# Patient Record
Sex: Male | Born: 2000 | Race: Black or African American | Hispanic: No | Marital: Single | Smoking: Current every day smoker
Health system: Southern US, Community
[De-identification: ages and names within clinical notes are randomized; demographics above are authoritative.]

---
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
## 2019-03-21 ENCOUNTER — Encounter (HOSPITAL_COMMUNITY): Payer: Self-pay | Admitting: *Deleted

## 2019-03-21 ENCOUNTER — Other Ambulatory Visit: Payer: Self-pay

## 2019-03-21 ENCOUNTER — Emergency Department (HOSPITAL_COMMUNITY): Payer: Federal, State, Local not specified - PPO

## 2019-03-21 ENCOUNTER — Emergency Department (HOSPITAL_COMMUNITY)
Admission: EM | Admit: 2019-03-21 | Discharge: 2019-03-21 | Disposition: A | Discharge status: Home / Self Care | Payer: Federal, State, Local not specified - PPO | Attending: Emergency Medicine | Admitting: Emergency Medicine

## 2019-03-21 DIAGNOSIS — T07XXXA Unspecified multiple injuries, initial encounter: Secondary | ICD-10-CM | POA: Diagnosis not present

## 2019-03-21 DIAGNOSIS — S2194XS Puncture wound with foreign body of unspecified part of thorax, sequela: Secondary | ICD-10-CM | POA: Diagnosis not present

## 2019-03-21 DIAGNOSIS — Y939 Activity, unspecified: Secondary | ICD-10-CM | POA: Insufficient documentation

## 2019-03-21 DIAGNOSIS — Z23 Encounter for immunization: Secondary | ICD-10-CM | POA: Insufficient documentation

## 2019-03-21 DIAGNOSIS — F17210 Nicotine dependence, cigarettes, uncomplicated: Secondary | ICD-10-CM | POA: Insufficient documentation

## 2019-03-21 DIAGNOSIS — Y999 Unspecified external cause status: Secondary | ICD-10-CM | POA: Diagnosis not present

## 2019-03-21 DIAGNOSIS — Y249XXA Unspecified firearm discharge, undetermined intent, initial encounter: Secondary | ICD-10-CM | POA: Insufficient documentation

## 2019-03-21 DIAGNOSIS — S41101A Unspecified open wound of right upper arm, initial encounter: Secondary | ICD-10-CM | POA: Diagnosis not present

## 2019-03-21 DIAGNOSIS — S21402A Unspecified open wound of left back wall of thorax with penetration into thoracic cavity, initial encounter: Secondary | ICD-10-CM | POA: Diagnosis not present

## 2019-03-21 DIAGNOSIS — Y929 Unspecified place or not applicable: Secondary | ICD-10-CM | POA: Insufficient documentation

## 2019-03-21 DIAGNOSIS — M25519 Pain in unspecified shoulder: Secondary | ICD-10-CM | POA: Diagnosis not present

## 2019-03-21 DIAGNOSIS — R52 Pain, unspecified: Secondary | ICD-10-CM | POA: Diagnosis not present

## 2019-03-21 DIAGNOSIS — W3400XA Accidental discharge from unspecified firearms or gun, initial encounter: Secondary | ICD-10-CM

## 2019-03-21 LAB — PREPARE FRESH FROZEN PLASMA
Unit division: 0
Unit division: 0

## 2019-03-21 LAB — BASIC METABOLIC PANEL
Anion gap: 10 (ref 5–15)
BUN: 12 mg/dL (ref 6–20)
CO2: 23 mmol/L (ref 22–32)
Calcium: 9.7 mg/dL (ref 8.9–10.3)
Chloride: 106 mmol/L (ref 98–111)
Creatinine, Ser: 1.28 mg/dL — ABNORMAL HIGH (ref 0.61–1.24)
GFR calc Af Amer: >60 mL/min (ref >60)
GFR calc non Af Amer: >60 mL/min (ref >60)
Glucose, Bld: 94 mg/dL (ref 70–99)
Potassium: 3.6 mmol/L (ref 3.5–5.1)
Sodium: 139 mmol/L (ref 135–145)

## 2019-03-21 LAB — CBC
HCT: 38.3 % — ABNORMAL LOW (ref 39.0–52.0)
Hemoglobin: 12.2 g/dL — ABNORMAL LOW (ref 13.0–17.0)
MCH: 26.8 pg (ref 26.0–34.0)
MCHC: 31.9 g/dL (ref 30.0–36.0)
MCV: 84 fL (ref 80.0–100.0)
Platelets: 201 10*3/uL (ref 150–400)
RBC: 4.56 MIL/uL (ref 4.22–5.81)
RDW: 14.2 % (ref 11.5–15.5)
WBC: 9.9 10*3/uL (ref 4.0–10.5)
nRBC: 0 % (ref 0.0–0.2)

## 2019-03-21 LAB — TYPE AND SCREEN
Unit division: 0
Unit division: 0

## 2019-03-21 LAB — BPAM RBC
Blood Product Expiration Date: 202004302359
Blood Product Expiration Date: 202004302359
ISSUE DATE / TIME: 202004120456
ISSUE DATE / TIME: 202004120456
Unit Type and Rh: 5100
Unit Type and Rh: 5100

## 2019-03-21 LAB — BPAM FFP
Blood Product Expiration Date: 202004182359
Blood Product Expiration Date: 202004182359
ISSUE DATE / TIME: 202004120456
ISSUE DATE / TIME: 202004120456
Unit Type and Rh: 6200
Unit Type and Rh: 6200

## 2019-03-21 MED ORDER — SULFAMETHOXAZOLE-TRIMETHOPRIM 800-160 MG PO TABS
1.0000 | ORAL_TABLET | Freq: Once | ORAL | Status: AC
  Administered 2019-03-21: 07:00:00 1 via ORAL
  Filled 2019-03-21: qty 1

## 2019-03-21 MED ORDER — IOHEXOL 300 MG/ML  SOLN
75.0000 mL | Freq: Once | INTRAMUSCULAR | Status: AC | PRN
  Administered 2019-03-21: 75 mL via INTRAVENOUS

## 2019-03-21 MED ORDER — TETANUS-DIPHTH-ACELL PERTUSSIS 5-2.5-18.5 LF-MCG/0.5 IM SUSP
0.5000 mL | Freq: Once | INTRAMUSCULAR | Status: AC
  Administered 2019-03-21: 07:00:00 0.5 mL via INTRAMUSCULAR
  Filled 2019-03-21: qty 0.5

## 2019-03-21 MED ORDER — SULFAMETHOXAZOLE-TRIMETHOPRIM 800-160 MG PO TABS: 1.0000 | ORAL_TABLET | Freq: Two times a day (BID) | ORAL | 0 refills | Status: AC
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
## 2019-03-21 NOTE — ED Notes (Signed)
Dr. pollina at the bedside.  

## 2019-03-21 NOTE — ED Notes (Signed)
Down graded to a  Level 2
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
## 2019-03-21 NOTE — Discharge Instructions (Signed)
Go to [facility] urgent care or return here in 7 to 10 days to have sutures removed.
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
## 2019-03-21 NOTE — ED Notes (Signed)
bp 152/ 98 manual

## 2019-03-22 ENCOUNTER — Encounter (HOSPITAL_COMMUNITY): Payer: Self-pay | Admitting: Emergency Medicine

## 2019-09-25 ENCOUNTER — Other Ambulatory Visit: Payer: Self-pay

## 2019-09-25 ENCOUNTER — Encounter (HOSPITAL_COMMUNITY): Payer: Self-pay

## 2019-09-25 ENCOUNTER — Ambulatory Visit (HOSPITAL_COMMUNITY)
Admission: EM | Admit: 2019-09-25 | Discharge: 2019-09-25 | Disposition: A | Discharge status: Home / Self Care | Payer: Federal, State, Local not specified - PPO | Attending: Family Medicine | Admitting: Family Medicine

## 2019-09-25 DIAGNOSIS — L03011 Cellulitis of right finger: Secondary | ICD-10-CM | POA: Diagnosis not present

## 2019-09-25 MED ORDER — SULFAMETHOXAZOLE-TRIMETHOPRIM 800-160 MG PO TABS: 1.0000 | ORAL_TABLET | Freq: Two times a day (BID) | ORAL | 0 refills | Status: AC

## 2019-09-25 NOTE — ED Provider Notes (Signed)
Jeremy Nelson    CSN: [id number] Arrival date & time: 09/25/19  1123      History   Chief Complaint Chief Complaint  Patient presents with  . Nail Problem    HPI Jeremy Nelson is a 18 y.o. adult.   Jeremy Nelson presents with complaints of right hand ring finger with pain swelling and drainage. Started 1 week ago and has been working. Drainage noted today. Has been taking OTC medications such as aleve and ibuprofen to help with pain with minimal relief. Denies pulling a hang nail or biting his nails. Denies any previous similar. No fevers. Without contributing medical history.      ROS per HPI, negative if not otherwise mentioned.      History reviewed. No pertinent past medical history.  There are no active problems to display for this patient.   History reviewed. No pertinent surgical history.  OB History   No obstetric history on file.      Home Medications    Prior to Admission medications   Medication Sig Start Date End Date Taking? Authorizing Provider  ondansetron (ZOFRAN ODT) 4 MG disintegrating tablet Take 1 tablet (4 mg total) by mouth every 8 (eight) hours as needed for nausea or vomiting. 11/15/16   Charmayne Sheer, NP  sulfamethoxazole-trimethoprim (BACTRIM DS) 800-160 MG tablet Take 1 tablet by mouth 2 (two) times daily for 7 days. 09/25/19 10/02/19  Zigmund Gottron, NP  traMADol-acetaminophen (ULTRACET) 37.5-325 MG per tablet Take 1 tablet by mouth every 6 (six) hours as needed for pain. 03/09/13   Janne Napoleon, NP    Family History No family history on file.  Social History Social History   Tobacco Use  . Smoking status: Current Every Day Smoker  . Smokeless tobacco: Never Used  Substance Use Topics  . Alcohol use: Yes  . Drug use: Never    Types: Marijuana     Allergies   Patient has no known allergies.   Review of Systems Review of Systems   Physical Exam Triage Vital Signs ED Triage Vitals [09/25/19 1232]   Enc Vitals Group     BP (!) 141/95     Pulse Rate 73     Resp 16     Temp 98.5 F (36.9 C)     Temp Source Oral     SpO2 100 %     Weight 146 lb (66.2 kg)     Height      Head Circumference      Peak Flow      Pain Score 8     Pain Loc      Pain Edu?      Excl. in [place]?    No data found.  Updated Vital Signs BP (!) 141/95 (BP Location: Right Arm)   Pulse 73   Temp 98.5 F (36.9 C) (Oral)   Resp 16   Wt 146 lb (66.2 kg)   SpO2 100%   BMI 18.25 kg/m    Physical Exam Constitutional:      General: He is not in acute distress.    Appearance: He is well-developed.  Cardiovascular:     Rate and Rhythm: Normal rate.  Pulmonary:     Effort: Pulmonary effort is normal.  Musculoskeletal:     Comments: Right hand ring finger with gross swelling, significant tenderness, and redness surrounding nail bed with purulent drainage; pad of finger remains soft and with minimal tenderness  Skin:    General: Skin  is warm and dry.  Neurological:     Mental Status: He is alert and oriented to person, place, and time.      UC Treatments / Results  Labs (all labs ordered are listed, but only abnormal results are displayed) Labs Reviewed - No data to display  EKG   Radiology No results found.  Procedures Procedures (including critical care time)  Medications Ordered in UC Medications - No data to display  Initial Impression / Assessment and Plan / UC Course  I have reviewed the triage vital signs and the nursing notes.  Pertinent labs & imaging results that were available during my care of the patient were reviewed by me and considered in my medical decision making (see chart for details).    Significant paronychia on exam, already draining (dripping down his finger even), deferred additional incision today. Encouraged warm soaks to promote further drainage, and antibiotics initiated. Return precautions provided. Patient verbalized understanding and agreeable to plan.    Final Clinical Impressions(s) / UC Diagnoses   Final diagnoses:  Paronychia of finger of right hand     Discharge Instructions     Soak in warm water 3-5 times a day for 10-20 minutes at a time to promote further drainage from the finger.  Complete course of antibiotics.  Tylenol and/or ibuprofen as needed for pain or fevers.  Don't take aleve and ibuprofen, one or the other.  I expect gradual improvement over the next week.  If worsening, especially if a lot of pain to the pad of the finger, or no improvement at all in the next 5 days please return to be seen.    ED Prescriptions    Medication Sig Dispense Auth. Provider   sulfamethoxazole-trimethoprim (BACTRIM DS) 800-160 MG tablet Take 1 tablet by mouth 2 (two) times daily for 7 days. 14 tablet Georgetta Haber, NP     PDMP not reviewed this encounter.   Georgetta Haber, NP 09/25/19 1256

## 2019-09-25 NOTE — ED Triage Notes (Signed)
Pt states he has infected finger x 1 week. ( right hand ring finger )

## 2019-09-25 NOTE — Discharge Instructions (Signed)
Soak in warm water 3-5 times a day for 10-20 minutes at a time to promote further drainage from the finger.  Complete course of antibiotics.  Tylenol and/or ibuprofen as needed for pain or fevers.  Don't take aleve and ibuprofen, one or the other.  I expect gradual improvement over the next week.  If worsening, especially if a lot of pain to the pad of the finger, or no improvement at all in the next 5 days please return to be seen.

## 2024-06-15 ENCOUNTER — Ambulatory Visit
Admission: EM | Admit: 2024-06-15 | Discharge: 2024-06-15 | Disposition: A | Discharge status: Home / Self Care | Attending: Family Medicine | Admitting: Family Medicine

## 2024-06-15 DIAGNOSIS — H1012 Acute atopic conjunctivitis, left eye: Secondary | ICD-10-CM | POA: Diagnosis not present

## 2024-06-15 MED ORDER — AZELASTINE HCL 0.05 % OP SOLN: 1.0000 [drp] | Freq: Two times a day (BID) | OPHTHALMIC | 0 refills | Status: AC

## 2024-06-15 NOTE — ED Provider Notes (Signed)
 [facility]    CSN: [id number] Arrival date & time: 06/15/24  1901      History   Chief Complaint Chief Complaint  Patient presents with   Eye Problem    HPI Jeremy Nelson is a 23 y.o. adult presents for eye redness.  Patient reports 2 to 3 days of left eye redness with watery drainage, itching and irritation.  Denies foreign body sensation, photophobia, injury to the eye, visual changes.  Does not wear glasses or contacts.  No URI symptoms.  Has not used any OTC treatments for symptoms.  No other concerns at this time.   Eye Problem Associated symptoms: itching and redness     History reviewed. No pertinent past medical history.  There are no active problems to display for this patient.   History reviewed. No pertinent surgical history.  OB History   No obstetric history on file.      Home Medications    Prior to Admission medications   Medication Sig Start Date End Date Taking? Authorizing Provider  azelastine  (OPTIVAR ) 0.05 % ophthalmic solution Place 1 drop into the left eye 2 (two) times daily for 7 days. 06/15/24 06/22/24 Yes Kendric Sindelar, Jodi R, NP  ondansetron  (ZOFRAN  ODT) 4 MG disintegrating tablet Take 1 tablet (4 mg total) by mouth every 8 (eight) hours as needed for nausea or vomiting. 11/15/16   Lang Maxwell, NP  traMADol -acetaminophen  (ULTRACET ) 37.5-325 MG per tablet Take 1 tablet by mouth every 6 (six) hours as needed for pain. 03/09/13   Tharon Lenis, NP    Family History History reviewed. No pertinent family history.  Social History Social History   Tobacco Use   Smoking status: Every Day   Smokeless tobacco: Never  Substance Use Topics   Alcohol use: Yes   Drug use: Never    Types: Marijuana     Allergies   Patient has no known allergies.   Review of Systems Review of Systems  Eyes:  Positive for redness and itching.     Physical Exam Triage Vital Signs ED Triage Vitals [06/15/24 1909]  Encounter Vitals Group     BP  132/83     Girls Systolic BP Percentile      Girls Diastolic BP Percentile      Boys Systolic BP Percentile      Boys Diastolic BP Percentile      Pulse Rate 84     Resp 17     Temp      Temp src      SpO2 98 %     Weight      Height      Head Circumference      Peak Flow      Pain Score      Pain Loc      Pain Education      Exclude from Growth Chart    No data found.  Updated Vital Signs BP 132/83 (BP Location: Right Arm)   Pulse 84   Resp 17   SpO2 98%   Visual Acuity Right Eye Distance: 20/50 Left Eye Distance: 20/30 Bilateral Distance:    Right Eye Near:   Left Eye Near:    Bilateral Near:     Physical Exam Vitals and nursing note reviewed.  Constitutional:      General: He is not in acute distress.    Appearance: Normal appearance. He is not ill-appearing.  HENT:     Head: Normocephalic and atraumatic.  Eyes:  General: Lids are normal.        Left eye: No foreign body, discharge or hordeolum.     Conjunctiva/sclera:     Left eye: Left conjunctiva is injected. No chemosis, exudate or hemorrhage.    Pupils: Pupils are equal, round, and reactive to light.     Comments: Watery drainage noted  Cardiovascular:     Rate and Rhythm: Normal rate.  Pulmonary:     Effort: Pulmonary effort is normal.  Skin:    General: Skin is warm and dry.  Neurological:     General: No focal deficit present.     Mental Status: He is alert and oriented to person, place, and time.  Psychiatric:        Mood and Affect: Mood normal.        Behavior: Behavior normal.      UC Treatments / Results  Labs (all labs ordered are listed, but only abnormal results are displayed) Labs Reviewed - No data to display  EKG   Radiology No results found.  Procedures Procedures (including critical care time)  Medications Ordered in UC Medications - No data to display  Initial Impression / Assessment and Plan / UC Course  I have reviewed the triage vital signs and the  nursing notes.  Pertinent labs & imaging results that were available during my care of the patient were reviewed by me and considered in my medical decision making (see chart for details).     Reviewed exam and symptoms with patient.  No red flags.  Discussed exam and symptoms consistent with allergic conjunctivitis.  Visual acuity with better acuity in the affected eye.  Start antihistamine eyedrops as prescribed.  Warm compresses as needed.  PCP follow-up if symptoms do not improve.  ER precautions reviewed. Final Clinical Impressions(s) / UC Diagnoses   Final diagnoses:  Allergic conjunctivitis of left eye     Discharge Instructions      Start antihistamine eyedrops twice daily for the next 5 to 7 days.  You may do warm compresses to the eye as needed.  Please follow-up with your PCP if your symptoms do not improve.  Please go to the ER for any worsening symptoms.  I hope you feel better soon!    ED Prescriptions     Medication Sig Dispense Auth. Provider   azelastine  (OPTIVAR ) 0.05 % ophthalmic solution Place 1 drop into the left eye 2 (two) times daily for 7 days. 6 mL Biddie Sebek, Jodi R, NP      PDMP not reviewed this encounter.   Loreda Myla SAUNDERS, NP 06/15/24 1924

## 2024-06-15 NOTE — ED Triage Notes (Signed)
 Pt present with c/o lt eye redness and irritation. Pt present with eye drainage as well.

## 2024-06-15 NOTE — Discharge Instructions (Addendum)
 Start antihistamine eyedrops twice daily for the next 5 to 7 days.  You may do warm compresses to the eye as needed.  Please follow-up with your PCP if your symptoms do not improve.  Please go to the ER for any worsening symptoms.  I hope you feel better soon!

## 2024-11-19 ENCOUNTER — Other Ambulatory Visit: Payer: Self-pay

## 2024-11-19 ENCOUNTER — Ambulatory Visit
Admission: EM | Admit: 2024-11-19 | Discharge: 2024-11-19 | Disposition: A | Discharge status: Home / Self Care | Attending: Family Medicine | Admitting: Family Medicine

## 2024-11-19 DIAGNOSIS — Z202 Contact with and (suspected) exposure to infections with a predominantly sexual mode of transmission: Secondary | ICD-10-CM | POA: Diagnosis not present

## 2024-11-19 MED ORDER — CEFTRIAXONE SODIUM 500 MG IJ SOLR
500.0000 mg | INTRAMUSCULAR | Status: DC
  Administered 2024-11-19: 500 mg via INTRAMUSCULAR

## 2024-11-19 NOTE — ED Triage Notes (Signed)
 Pt presents to urgent care for STD testing. Significant other tested positive for Gonorrhea recently. Currently denies symptoms or pain.

## 2024-11-19 NOTE — ED Provider Notes (Signed)
 [facility]    CSN: [id number] Arrival date & time: 11/19/24  1427      History   Chief Complaint Chief Complaint  Patient presents with   SEXUALLY TRANSMITTED DISEASE    HPI COBY SHREWSBERRY is a 23 y.o. adult.   HPI Pleasant 23 year old male presents with concern with potential STD.  Patient reports that his partner recently informed him that she was positive for gonorrhea.  Patient request treatment for gonorrhea this afternoon.  History reviewed. No pertinent past medical history.  There are no active problems to display for this patient.   History reviewed. No pertinent surgical history.  OB History   No obstetric history on file.      Home Medications    Prior to Admission medications  Not on File    Family History History reviewed. No pertinent family history.  Social History Social History[1]   Allergies   Patient has no known allergies.   Review of Systems Review of Systems  All other systems reviewed and are negative.    Physical Exam Triage Vital Signs ED Triage Vitals  Encounter Vitals Group     BP      Girls Systolic BP Percentile      Girls Diastolic BP Percentile      Boys Systolic BP Percentile      Boys Diastolic BP Percentile      Pulse      Resp      Temp      Temp src      SpO2      Weight      Height      Head Circumference      Peak Flow      Pain Score      Pain Loc      Pain Education      Exclude from Growth Chart    No data found.  Updated Vital Signs BP 138/83 (BP Location: Right Arm)   Pulse 67   Temp 98.1 F (36.7 C) (Oral)   Resp 16   Ht 6' 3 (1.905 m)   Wt 160 lb (72.6 kg)   SpO2 100%   BMI 20.00 kg/m    Physical Exam Vitals and nursing note reviewed.  Constitutional:      Appearance: Normal appearance. He is normal weight.  HENT:     Head: Normocephalic and atraumatic.     Mouth/Throat:     Mouth: Mucous membranes are moist.     Pharynx: Oropharynx is clear.  Eyes:      Extraocular Movements: Extraocular movements intact.     Conjunctiva/sclera: Conjunctivae normal.     Pupils: Pupils are equal, round, and reactive to light.  Cardiovascular:     Rate and Rhythm: Normal rate and regular rhythm.     Heart sounds: Normal heart sounds.  Pulmonary:     Effort: Pulmonary effort is normal.     Breath sounds: Normal breath sounds. No wheezing, rhonchi or rales.  Musculoskeletal:        General: Normal range of motion.     Cervical back: Neck supple.  Skin:    General: Skin is warm and dry.  Neurological:     General: No focal deficit present.     Mental Status: He is alert and oriented to person, place, and time.  Psychiatric:        Mood and Affect: Mood normal.        Behavior: Behavior normal.  UC Treatments / Results  Labs (all labs ordered are listed, but only abnormal results are displayed) Labs Reviewed  CYTOLOGY, (ORAL, ANAL, URETHRAL) ANCILLARY ONLY    EKG   Radiology No results found.  Procedures Procedures (including critical care time)  Medications Ordered in UC Medications  cefTRIAXone (ROCEPHIN) injection 500 mg (500 mg Intramuscular Given 11/19/24 1520)    Initial Impression / Assessment and Plan / UC Course  I have reviewed the triage vital signs and the nursing notes.  Pertinent labs & imaging results that were available during my care of the patient were reviewed by me and considered in my medical decision making (see chart for details).     MDM: 1.  Exposure to STD-IM Rocephin 500 mg given once and clinic. Advised patient take medications as directed with food to completion.  Encouraged increase daily water intake to 64 ounces per day while taking this medication.  Advised we will follow-up with Aptima swab results once received.  Advised if symptoms worsen and/or unresolved please follow-up with PCP or here for further evaluation.  Patient discharged home, hemodynamically stable. Final Clinical Impressions(s) / UC  Diagnoses   Final diagnoses:  Exposure to STD     Discharge Instructions      Advised patient take medications as directed with food to completion.  Encouraged increase daily water intake to 64 ounces per day while taking this medication.  Advised we will follow-up with [facility] swab results once received.  Advised if symptoms worsen and/or unresolved please follow-up with PCP or here for further evaluation.     ED Prescriptions   None    PDMP not reviewed this encounter.    [1]  Social History Tobacco Use   Smoking status: Every Day   Smokeless tobacco: Never  Vaping Use   Vaping status: Never Used  Substance Use Topics   Alcohol use: Yes   Drug use: Yes    Types: Marijuana    Comment: everyday     Teddy Sharper, FNP 11/19/24 1526

## 2024-11-19 NOTE — Discharge Instructions (Addendum)
 Advised patient take medications as directed with food to completion.  Encouraged increase daily water intake to 64 ounces per day while taking this medication.  Advised we will follow-up with [facility] swab results once received.  Advised if symptoms worsen and/or unresolved please follow-up with PCP or here for further evaluation.

## 2024-11-22 ENCOUNTER — Ambulatory Visit (HOSPITAL_COMMUNITY): Payer: Self-pay

## 2024-11-22 LAB — CYTOLOGY, (ORAL, ANAL, URETHRAL) ANCILLARY ONLY
Chlamydia: POSITIVE — AB
Comment: NEGATIVE
Comment: NEGATIVE
Comment: NORMAL
Neisseria Gonorrhea: POSITIVE — AB
Trichomonas: NEGATIVE

## 2024-11-22 MED ORDER — DOXYCYCLINE HYCLATE 100 MG PO TABS: 100.0000 mg | ORAL_TABLET | Freq: Two times a day (BID) | ORAL | 0 refills | Status: AC
